# Patient Record
Sex: Female | Born: 1935 | Race: White | Hispanic: No | Smoking: Never smoker
Health system: Southern US, Community
[De-identification: ages and names within clinical notes are randomized; demographics above are authoritative.]

## PROBLEM LIST (undated history)

## (undated) DIAGNOSIS — E119 Type 2 diabetes mellitus without complications: Secondary | ICD-10-CM

## (undated) DIAGNOSIS — I10 Essential (primary) hypertension: Secondary | ICD-10-CM

## (undated) DIAGNOSIS — G309 Alzheimer's disease, unspecified: Secondary | ICD-10-CM

## (undated) DIAGNOSIS — F0280 Dementia in other diseases classified elsewhere without behavioral disturbance: Secondary | ICD-10-CM

---

## 2004-10-18 ENCOUNTER — Ambulatory Visit: Payer: Self-pay | Admitting: Internal Medicine

## 2005-01-10 ENCOUNTER — Ambulatory Visit: Payer: Self-pay

## 2005-03-07 ENCOUNTER — Ambulatory Visit: Payer: Self-pay | Admitting: Unknown Physician Specialty

## 2005-07-06 ENCOUNTER — Ambulatory Visit: Payer: Self-pay | Admitting: Internal Medicine

## 2005-07-22 ENCOUNTER — Ambulatory Visit: Payer: Self-pay | Admitting: General Surgery

## 2005-07-29 ENCOUNTER — Ambulatory Visit: Payer: Self-pay | Admitting: General Surgery

## 2006-01-02 ENCOUNTER — Ambulatory Visit: Payer: Self-pay | Admitting: Internal Medicine

## 2006-01-18 ENCOUNTER — Ambulatory Visit: Payer: Self-pay | Admitting: Pain Medicine

## 2006-05-15 ENCOUNTER — Ambulatory Visit: Payer: Self-pay | Admitting: Internal Medicine

## 2006-05-26 ENCOUNTER — Ambulatory Visit: Payer: Self-pay | Admitting: Internal Medicine

## 2006-06-25 ENCOUNTER — Ambulatory Visit: Payer: Self-pay | Admitting: Internal Medicine

## 2006-08-01 ENCOUNTER — Ambulatory Visit: Payer: Self-pay | Admitting: Internal Medicine

## 2006-08-04 ENCOUNTER — Ambulatory Visit: Payer: Self-pay | Admitting: Internal Medicine

## 2006-08-26 ENCOUNTER — Ambulatory Visit: Payer: Self-pay | Admitting: Internal Medicine

## 2007-01-09 ENCOUNTER — Ambulatory Visit: Payer: Self-pay | Admitting: Internal Medicine

## 2007-06-14 ENCOUNTER — Ambulatory Visit: Payer: Self-pay | Admitting: Internal Medicine

## 2008-03-12 ENCOUNTER — Ambulatory Visit: Payer: Self-pay | Admitting: Internal Medicine

## 2009-02-19 ENCOUNTER — Ambulatory Visit: Payer: Self-pay | Admitting: Internal Medicine

## 2009-03-24 ENCOUNTER — Ambulatory Visit: Payer: Self-pay | Admitting: Internal Medicine

## 2009-07-01 ENCOUNTER — Ambulatory Visit: Payer: Self-pay | Admitting: Rheumatology

## 2010-03-25 ENCOUNTER — Ambulatory Visit: Payer: Self-pay | Admitting: Internal Medicine

## 2010-06-22 ENCOUNTER — Ambulatory Visit: Payer: Self-pay | Admitting: Urology

## 2010-08-02 ENCOUNTER — Emergency Department: Payer: Self-pay | Admitting: Emergency Medicine

## 2010-09-22 ENCOUNTER — Ambulatory Visit: Payer: Self-pay | Admitting: Internal Medicine

## 2010-09-25 ENCOUNTER — Ambulatory Visit: Payer: Self-pay | Admitting: Internal Medicine

## 2010-10-26 ENCOUNTER — Ambulatory Visit: Payer: Self-pay | Admitting: Internal Medicine

## 2011-04-05 ENCOUNTER — Ambulatory Visit: Payer: Self-pay | Admitting: Internal Medicine

## 2011-07-19 ENCOUNTER — Ambulatory Visit: Payer: Self-pay | Admitting: Specialist

## 2011-09-27 ENCOUNTER — Ambulatory Visit: Payer: Self-pay | Admitting: Gastroenterology

## 2011-10-11 ENCOUNTER — Ambulatory Visit: Payer: Self-pay | Admitting: Gastroenterology

## 2011-10-20 ENCOUNTER — Ambulatory Visit: Payer: Self-pay | Admitting: Internal Medicine

## 2012-01-31 ENCOUNTER — Ambulatory Visit: Payer: Self-pay | Admitting: Specialist

## 2012-03-19 ENCOUNTER — Emergency Department: Payer: Self-pay | Admitting: *Deleted

## 2012-03-19 LAB — URINALYSIS, COMPLETE
Bilirubin,UR: NEGATIVE
Blood: NEGATIVE
Glucose,UR: 50 mg/dL (ref 0–75)
Hyaline Cast: 3
Nitrite: NEGATIVE
Ph: 5 (ref 4.5–8.0)
Protein: NEGATIVE
Specific Gravity: 1.017 (ref 1.003–1.030)
WBC UR: 1 /HPF (ref 0–5)

## 2012-03-19 LAB — COMPREHENSIVE METABOLIC PANEL
Albumin: 3.7 g/dL (ref 3.4–5.0)
Alkaline Phosphatase: 74 U/L (ref 50–136)
BUN: 19 mg/dL — ABNORMAL HIGH (ref 7–18)
Bilirubin,Total: 0.5 mg/dL (ref 0.2–1.0)
Calcium, Total: 9.3 mg/dL (ref 8.5–10.1)
Co2: 22 mmol/L (ref 21–32)
EGFR (Non-African Amer.): >60
Glucose: 218 mg/dL — ABNORMAL HIGH (ref 65–99)
Osmolality: 285 (ref 275–301)
Potassium: 4 mmol/L (ref 3.5–5.1)
SGPT (ALT): 20 U/L
Sodium: 138 mmol/L (ref 136–145)

## 2012-03-19 LAB — CBC: MCHC: 33.9 g/dL (ref 32.0–36.0)

## 2012-03-19 LAB — TROPONIN I: Troponin-I: <0.02 ng/mL

## 2012-07-20 ENCOUNTER — Ambulatory Visit: Payer: Self-pay | Admitting: Internal Medicine

## 2012-09-03 ENCOUNTER — Ambulatory Visit: Payer: Self-pay | Admitting: General Practice

## 2012-09-03 LAB — BASIC METABOLIC PANEL
Creatinine: 0.71 mg/dL (ref 0.60–1.30)
EGFR (African American): >60
EGFR (Non-African Amer.): >60
Glucose: 230 mg/dL — ABNORMAL HIGH (ref 65–99)
Potassium: 4.3 mmol/L (ref 3.5–5.1)
Sodium: 137 mmol/L (ref 136–145)

## 2012-09-03 LAB — URINALYSIS, COMPLETE
Bilirubin,UR: NEGATIVE
Glucose,UR: 50 mg/dL (ref 0–75)
Ketone: NEGATIVE
Protein: NEGATIVE
RBC,UR: 1 /HPF (ref 0–5)
Specific Gravity: 1.017 (ref 1.003–1.030)
Squamous Epithelial: 1
WBC UR: 1 /HPF (ref 0–5)

## 2012-09-03 LAB — SEDIMENTATION RATE: Erythrocyte Sed Rate: 29 mm/hr (ref 0–30)

## 2012-09-03 LAB — CBC
HCT: 34 % — ABNORMAL LOW (ref 35.0–47.0)
MCH: 31.3 pg (ref 26.0–34.0)
MCHC: 34.3 g/dL (ref 32.0–36.0)
MCV: 91 fL (ref 80–100)
Platelet: 209 10*3/uL (ref 150–440)
RDW: 13.2 % (ref 11.5–14.5)
WBC: 5.5 10*3/uL (ref 3.6–11.0)

## 2012-09-03 LAB — MRSA PCR SCREENING

## 2012-09-03 LAB — APTT: Activated PTT: 31.1 secs (ref 23.6–35.9)

## 2012-09-03 LAB — PROTIME-INR: Prothrombin Time: 13.7 secs (ref 11.5–14.7)

## 2012-09-04 LAB — URINE CULTURE

## 2012-09-06 ENCOUNTER — Ambulatory Visit: Payer: Self-pay | Admitting: Specialist

## 2012-09-17 ENCOUNTER — Inpatient Hospital Stay: Payer: Self-pay | Admitting: General Practice

## 2012-09-18 LAB — BASIC METABOLIC PANEL
BUN: 19 mg/dL — ABNORMAL HIGH (ref 7–18)
Calcium, Total: 8.6 mg/dL (ref 8.5–10.1)
Co2: 22 mmol/L (ref 21–32)
Creatinine: 0.97 mg/dL (ref 0.60–1.30)
EGFR (Non-African Amer.): 53 — ABNORMAL LOW
Osmolality: 284 (ref 275–301)
Potassium: 4.2 mmol/L (ref 3.5–5.1)

## 2012-09-18 LAB — HEMOGLOBIN: HGB: 10.1 g/dL — ABNORMAL LOW (ref 12.0–16.0)

## 2012-09-18 LAB — PLATELET COUNT: Platelet: 160 10*3/uL (ref 150–440)

## 2012-09-19 LAB — BASIC METABOLIC PANEL
Calcium, Total: 8.9 mg/dL (ref 8.5–10.1)
Chloride: 106 mmol/L (ref 98–107)
Co2: 24 mmol/L (ref 21–32)
Creatinine: 0.89 mg/dL (ref 0.60–1.30)
EGFR (Non-African Amer.): 59 — ABNORMAL LOW
Osmolality: 280 (ref 275–301)
Potassium: 4.5 mmol/L (ref 3.5–5.1)

## 2012-09-21 ENCOUNTER — Encounter: Payer: Self-pay | Admitting: Internal Medicine

## 2012-09-25 ENCOUNTER — Encounter: Payer: Self-pay | Admitting: Internal Medicine

## 2013-12-22 ENCOUNTER — Emergency Department: Payer: Self-pay | Admitting: Emergency Medicine

## 2013-12-22 LAB — URINALYSIS, COMPLETE
Glucose,UR: NEGATIVE mg/dL (ref 0–75)
Ketone: NEGATIVE
Protein: NEGATIVE
RBC,UR: 2 /HPF (ref 0–5)
Specific Gravity: 1.013 (ref 1.003–1.030)

## 2013-12-22 LAB — COMPREHENSIVE METABOLIC PANEL
Alkaline Phosphatase: 100 U/L
BUN: 18 mg/dL (ref 7–18)
Bilirubin,Total: 0.6 mg/dL (ref 0.2–1.0)
Chloride: 105 mmol/L (ref 98–107)
Creatinine: 0.78 mg/dL (ref 0.60–1.30)
EGFR (African American): >60
EGFR (Non-African Amer.): >60
Osmolality: 280 (ref 275–301)
Potassium: 4.1 mmol/L (ref 3.5–5.1)
SGOT(AST): 14 U/L — ABNORMAL LOW (ref 15–37)
SGPT (ALT): 13 U/L (ref 12–78)
Total Protein: 6.9 g/dL (ref 6.4–8.2)

## 2013-12-22 LAB — CBC WITH DIFFERENTIAL/PLATELET
Basophil #: 0 10*3/uL (ref 0.0–0.1)
Basophil %: 0.6 %
Eosinophil %: 2 %
HGB: 12.4 g/dL (ref 12.0–16.0)
Lymphocyte #: 1.6 10*3/uL (ref 1.0–3.6)
MCH: 29.3 pg (ref 26.0–34.0)
MCHC: 33.8 g/dL (ref 32.0–36.0)
MCV: 87 fL (ref 80–100)
Monocyte %: 7.6 %
Neutrophil %: 63.3 %
RBC: 4.25 10*6/uL (ref 3.80–5.20)
RDW: 13.5 % (ref 11.5–14.5)
WBC: 5.9 10*3/uL (ref 3.6–11.0)

## 2013-12-22 LAB — TROPONIN I: Troponin-I: <0.02 ng/mL

## 2013-12-23 LAB — URINE CULTURE

## 2015-01-09 ENCOUNTER — Emergency Department: Payer: Self-pay | Admitting: Emergency Medicine

## 2015-01-09 LAB — CBC
HCT: 37.7 % (ref 35.0–47.0)
HGB: 12.5 g/dL (ref 12.0–16.0)
MCH: 31.3 pg (ref 26.0–34.0)
MCHC: 33.2 g/dL (ref 32.0–36.0)
MCV: 94 fL (ref 80–100)
Platelet: 223 10*3/uL (ref 150–440)
RBC: 4 10*6/uL (ref 3.80–5.20)
RDW: 13.3 % (ref 11.5–14.5)
WBC: 9.2 10*3/uL (ref 3.6–11.0)

## 2015-01-09 LAB — COMPREHENSIVE METABOLIC PANEL
ALK PHOS: 81 U/L
ALT: 18 U/L
AST: 19 U/L (ref 15–37)
Albumin: 3.8 g/dL (ref 3.4–5.0)
Anion Gap: 8 (ref 7–16)
BUN: 11 mg/dL (ref 7–18)
Bilirubin,Total: 0.5 mg/dL (ref 0.2–1.0)
CALCIUM: 9.6 mg/dL (ref 8.5–10.1)
CHLORIDE: 104 mmol/L (ref 98–107)
Co2: 25 mmol/L (ref 21–32)
Creatinine: 0.92 mg/dL (ref 0.60–1.30)
GLUCOSE: 240 mg/dL — AB (ref 65–99)
Osmolality: 281 (ref 275–301)
Potassium: 4.3 mmol/L (ref 3.5–5.1)
Sodium: 137 mmol/L (ref 136–145)
TOTAL PROTEIN: 7.1 g/dL (ref 6.4–8.2)

## 2015-04-14 NOTE — Op Note (Signed)
PATIENT NAME:  Christine [name], Christine [name] MR#:  [id number] DATE OF BIRTH:  10/20/1924  DATE OF PROCEDURE:  09/17/2012  PREOPERATIVE DIAGNOSIS: Degenerative arthrosis of the right knee.   POSTOPERATIVE DIAGNOSIS: Degenerative arthrosis of the right knee.   PROCEDURE PERFORMED: Right total knee arthroplasty using computer-assisted navigation.   SURGEON: Illene Labrador. Angie Fava., MD    ASSISTANT: Van Clines, PA-C (required to maintain retraction throughout the procedure)   ANESTHESIA: Femoral nerve block and spinal.   ESTIMATED BLOOD LOSS: 75 mL.   FLUIDS REPLACED: 2300 mL of Crystalloid.   TOURNIQUET TIME: 77 minutes.   DRAINS: Two medium drains to reinfusion system.   SOFT TISSUE RELEASES: Anterior cruciate ligament, posterior cruciate ligament, deep medial collateral ligament, and patellofemoral ligament.   IMPLANTS UTILIZED: DePuy PFC Sigma size 2.5 posterior stabilized femoral component (cemented), size 2.5 MBT tibial component (cemented), 32 mm three peg oval dome patella (cemented), and a 10 mm stabilized rotating platform polyethylene insert.   INDICATIONS FOR SURGERY: The patient is an 81 year old female who has been seen for complaints of progressive right knee pain. X-rays demonstrated severe degenerative changes in a tricompartmental fashion with relative valgus deformity. The patient has not seen any significant improvement despite conservative nonsurgical intervention. After discussion of the risks and benefits of surgical intervention, the patient expressed her understanding of the risks and benefits and agreed with plans for surgical intervention.   PROCEDURE IN DETAIL: The patient was brought in the operating room and, after adequate femoral nerve block and spinal anesthesia was achieved, a tourniquet was placed on the patient's upper right thigh. The patient's right knee and leg were cleaned and prepped with alcohol and DuraPrep and draped in the usual sterile fashion. A "time-out"  was performed as per usual protocol. The right lower extremity was exsanguinated using an Esmarch and the tourniquet was inflated to 300 mmHg. Anterior longitudinal incision was made followed by a standard mid vastus approach. A moderate effusion was evacuated. The deep fibers of the medial collateral ligament were elevated in a subperiosteal fashion off the medial flare of the tibia so as to maintain a continuous soft tissue sleeve. Patella was subluxed laterally and patellofemoral ligament was incised. Inspection of the knee demonstrated severe degenerative changes with evidence of eburnated bone noted to the medial compartment. Prominent osteophytes were debrided using a rongeur. Anterior and posterior cruciate ligaments were excised. Two 4.0 mm Schanz pins were inserted into the femur and into the tibia for attachment of the array of spheres used for computer-assisted navigation. Hip center was identified using circumduction technique. Distal landmarks were mapped using the computer. Distal femur and proximal tibia were mapped using the computer. Distal femoral cutting guide was positioned using computer-assisted navigation so as to achieve 5 degree distal valgus cut. Cut was performed and verified using the computer. Distal femur was sized and it was felt that a size 2.5 femoral component was appropriate. Size 2.5 cutting guide was positioned and the anterior cut was performed and verified using the computer. This was followed by completion of the posterior and chamfer cuts. Femoral cutting guide for central box was then positioned and the central box cut was performed.   Attention was then directed to the proximal tibia. Medial and lateral menisci were excised. The extramedullary tibial cutting guide was positioned using computer-assisted navigation so as to achieve 0 degree varus valgus alignment and 0 degree posterior slope. Cut was performed and verified using the computer. Tibia was sized and it was felt  that  a size 2.5 tibial tray was appropriate. Tibial and femoral trials were inserted followed by insertion of a 10 mm polyethylene insert. Excellent mediolateral soft tissue balancing was appreciated both in full extension and in flexion. Finally, patella was cut and prepared so as to accommodate a 32 mm three peg oval dome patella. Patellar trial was placed and the knee was placed through a range of motion with excellent patellar tracking appreciated. Femoral trial was then removed. Central post hole for the tibial component was reamed followed by insertion of a keel punch. Tibial trial was then removed. Cut surfaces of bone were irrigated with copious amounts of normal saline with antibiotic solution using pulsatile lavage and then suctioned dry. Polymethyl methacrylate cement with gentamicin was mixed in the usual fashion using a vacuum mixer. Cement was applied to the cut surface of the proximal tibia as well as along the undersurface of a size 2.5 MBT tibial component. Tibial component was positioned and impacted into place. Excess cement was removed using freer elevators. Cement was then applied to the cut surface of the femur as well as along the posterior flanges of a size 2.5 posterior stabilized femoral component. Femoral component was positioned and impacted into place. Excess cement was removed using freer elevators. A 10 mm stabilized rotating platform polyethylene trial was inserted and the knee was brought in full extension with steady axial compression applied. Finally, cement was applied to the backside of a 32 mm three peg oval dome patella and patellar component was positioned and patellar clamp applied. Excess cement was removed using freer elevators.   After adequate curing of cement, the tourniquet was deflated after a total tourniquet time of 77 minutes. Hemostasis was achieved using electrocautery. The knee was irrigated with copious amounts of normal saline with antibiotic solution using  pulsatile lavage and then suctioned dry. The knee was inspected for any residual cement debris. 30 mL of 0.25% Marcaine with epinephrine was injected along the posterior capsule. A 10 mm stabilized rotating platform polyethylene insert was inserted and the knee was placed through a range of motion. Excellent patellar tracking was appreciated and excellent mediolateral soft tissue balancing was noted. Two medium drains were placed in the wound bed and brought out through a separate stab incision to be attached to a reinfusion system. The medial parapatellar portion of the incision was reapproximated using interrupted sutures of #1 Vicryl. Subcutaneous tissue was approximated in layers using first #0 Vicryl followed by #2-0 Vicryl. Skin was closed with skin staples. Sterile dressing was applied.   The patient tolerated the procedure well. She was transported to the recovery room in stable condition.   ____________________________ Illene LabradorJames P. Angie FavaHooten Jr., MD [id number] D: 09/17/2012 22:56:04 ET T: 09/18/2012 10:03:50 ET JOB#: [id number]  cc: Illene LabradorJames P. Angie FavaHooten Jr., MD, <Dictator> JAMES P Angie FavaHOOTEN JR MD ELECTRONICALLY SIGNED 09/18/2012 20:38

## 2015-04-14 NOTE — Discharge Summary (Signed)
PATIENT NAME:  Christine Christine [name], Christine Christine [name] MR#:  [id number] DATE OF BIRTH:  [birth year]  DATE OF ADMISSION:  09/17/2012 DATE OF DISCHARGE:  09/20/2012  ADMITTING DIAGNOSIS: Degenerative arthrosis of the right knee.   DISCHARGE DIAGNOSIS: Degenerative arthrosis of the right knee.   HISTORY: The patient is an 81 year old female who [facility] of right knee pain. She had initially been followed by Dr[facility] and has had multiple aspirations and intraarticular cortisone injections as well as Synvisc injections. The patient states the pain continued to progress despite these interventions. She had reported pain both to the medial and lateral aspect of the knee. Her pain was noted be aggravated with weight-bearing activities. She did report some episodes of swelling of the knee as well as some near giving way. The pain had progressed to the point that it significantly interfered with her activities of daily living. X-rays taken in the clinic showed narrowing of the lateral cartilage space with associated valgus alignment. There was subchondral sclerosis noted. Some small osteophyte formation was noted. After discussion of the risks and benefits of surgical intervention, the patient expressed her understanding of the risks and benefits and agreed with plans for surgical intervention.   PROCEDURE: Right total knee arthroplasty using computer-assisted navigation.   ANESTHESIA: Femoral nerve block with spinal.   IMPLANTS UTILIZED: DePuy PFC Sigma size 2.5 posterior stabilized femoral component (cemented), size 2.5 MBT tibial component (cemented), 32 mm three pegged oval dome patella (cemented), and Christine [name] 10 mm stabilized rotating platform polyethylene insert.   HOSPITAL COURSE: The patient tolerated the procedure very well. She had no complications. She was then taken to the PAC-U where she was stabilized and then transferred to the orthopedic floor. She began receiving  anticoagulation therapy of Lovenox 30 mg subcutaneous every 12 hours per anesthesia and pharmacy protocol. She was fitted with TED stockings bilaterally. These were allowed to be removed one hour per eight hour shift. The right one was applied on day following removal of the Hemovac and dressing change. The patient was also fitted with the AV-I compression foot pumps bilaterally set at 80 mmHg. Her calves have been nontender. There has been no evidence of any deep venous thrombosis to the lower extremity. She has denied any chest pain or any shortness of breath. Heels were elevated off the bed using rolled towels.   Vital signs have been stable. She has been afebrile. Hemodynamically she was stable and no transfusions were given other than Autovac transfusions given the first six hours postoperatively. Laboratory studies were felt to be acceptable.   Physical therapy was initiated on day one for gait training and transfers. She has done very well. She has progressed very nicely. Occupational therapy was also initiated on day one for activities of daily living and assistive devices.   The patient's IV, Foley and Hemovac were discontinued on day two along with Christine [name] dressing change. The wound was free of any drainage or any signs of infection. The Polar Care was reapplied to the surgical leg maintaining Christine [name] temperature of 40 to 50 degrees Fahrenheit.   DISPOSITION: The patient is being discharged to [name] facility in improved stable condition.  DISCHARGE INSTRUCTIONS: Continue wearing TED stockings bilaterally. These are allowed to be removed one hour per eight hour shift. She may weight bear as tolerated. Incentive spirometer every 1 hour while awake. Encourage cough and deep breathing every two hours while awake. Polar Care to the surgical leg maintaining Christine [name]  temperature of 40 to 50 degrees Fahrenheit. Physical therapy for gait training and transfers, occupational therapy for activities of daily living  and assistive devices. Elevate heels off the bed. She is placed on an ADA diet. She has Christine [name] follow-up appointment in the clinic in two weeks, sooner if any temperatures of 101.5 or greater. Change dressing as needed.   DRUG ALLERGIES: Macrobid and penicillin.   DISCHARGE MEDICATIONS: 1. Tylenol ES 500 to 1000 mg every 4 to 6 hours p.r.n.  2. Celebrex 200 mg twice Christine [name] day. 3. Roxicodone 5 to 10 mg every four hours p.r.n. 4. Tramadol 50 to 100 mg every four hours p.r.n.  5. Dulcolax suppositories 10 mg rectally daily p.r.n.  6. Milk of magnesia 30 mL twice Christine [name] day.  7. Enema soapsuds if no results with milk of magnesia or Dulcolax.  8. Mylanta DS 30 mL every 6 hours.  9. Pantoprazole 40 mg twice Christine [name] day. 10. Senokot-S 1 tablet twice Christine [name] day. 11. Glipizide XL 10 mg before breakfast. 12. Metformin 1000 mg twice Christine [name] day with meals.  13. Insulin sliding scale Novolin R injections.  14. Hydrochlorothiazide 12.5 mg daily. 15. Lisinopril 40 mg daily. 16. Pravachol 40 mg at bedtime.  17. Ocuvite PreserVision 1 tablet daily. 18. Norvasc 10 mg daily.  19. Ferrous sulfate 325 mg daily with meal. 20. Pulmicort Flexhaler one puff inhalation every 12 hours.  21. Celexa 40 mg at bedtime.  22. Lovenox 30 mg subcutaneous every 12 hours for 14 days then discontinue and begin taking one 81 mg enteric coated aspirin unless contraindicated.  23. Zestril 40 mg at bedtime.  24. Pioglitazone 45 mg daily with meal.  PAST MEDICAL HISTORY:  1. Chickenpox. 2. Asthma.  3. Seasonal allergies.  4. Hypertension.  5. Hypercholesterolemia.  6. Type 2 diabetes mellitus.  7. Shingles.  8. Cataracts of bilateral eyes.  9. Osteoporosis.  10. Osteoarthritis.  11. Diverticulosis.  12. Fibrocystic breast disease.  13. Aortic insufficiency.         14. Venous stasis. 15. Anemia.  ____________________________ Van Clines, PA [id number] D: 09/20/2012 07:43:50 ET T: 09/20/2012 08:16:13 ET JOB#: [id number]  cc: Van Clines,  PA, <Dictator> JON WOLFE PA ELECTRONICALLY SIGNED 09/22/2012 20:47

## 2016-10-04 ENCOUNTER — Encounter: Payer: Self-pay | Admitting: Emergency Medicine

## 2016-10-04 ENCOUNTER — Emergency Department: Payer: Medicare Other

## 2016-10-04 ENCOUNTER — Emergency Department
Admission: EM | Admit: 2016-10-04 | Discharge: 2016-10-04 | Disposition: A | Discharge status: Home / Self Care | Payer: Medicare Other | Attending: Emergency Medicine | Admitting: Emergency Medicine

## 2016-10-04 DIAGNOSIS — E119 Type 2 diabetes mellitus without complications: Secondary | ICD-10-CM | POA: Insufficient documentation

## 2016-10-04 DIAGNOSIS — Y999 Unspecified external cause status: Secondary | ICD-10-CM | POA: Insufficient documentation

## 2016-10-04 DIAGNOSIS — S0990XA Unspecified injury of head, initial encounter: Secondary | ICD-10-CM | POA: Diagnosis present

## 2016-10-04 DIAGNOSIS — G309 Alzheimer's disease, unspecified: Secondary | ICD-10-CM | POA: Insufficient documentation

## 2016-10-04 DIAGNOSIS — Y92009 Unspecified place in unspecified non-institutional (private) residence as the place of occurrence of the external cause: Secondary | ICD-10-CM

## 2016-10-04 DIAGNOSIS — N3000 Acute cystitis without hematuria: Secondary | ICD-10-CM | POA: Diagnosis not present

## 2016-10-04 DIAGNOSIS — Y92018 Other place in single-family (private) house as the place of occurrence of the external cause: Secondary | ICD-10-CM | POA: Diagnosis not present

## 2016-10-04 DIAGNOSIS — S0083XA Contusion of other part of head, initial encounter: Secondary | ICD-10-CM | POA: Diagnosis not present

## 2016-10-04 DIAGNOSIS — W19XXXA Unspecified fall, initial encounter: Secondary | ICD-10-CM

## 2016-10-04 DIAGNOSIS — W1839XA Other fall on same level, initial encounter: Secondary | ICD-10-CM | POA: Diagnosis not present

## 2016-10-04 DIAGNOSIS — I10 Essential (primary) hypertension: Secondary | ICD-10-CM | POA: Diagnosis not present

## 2016-10-04 DIAGNOSIS — Y9389 Activity, other specified: Secondary | ICD-10-CM | POA: Insufficient documentation

## 2016-10-04 DIAGNOSIS — R5383 Other fatigue: Secondary | ICD-10-CM

## 2016-10-04 HISTORY — DX: Type 2 diabetes mellitus without complications: E11.9

## 2016-10-04 HISTORY — DX: Essential (primary) hypertension: I10

## 2016-10-04 HISTORY — DX: Dementia in other diseases classified elsewhere without behavioral disturbance: F02.80

## 2016-10-04 HISTORY — DX: Alzheimer's disease, unspecified: G30.9

## 2016-10-04 LAB — URINALYSIS COMPLETE WITH MICROSCOPIC (ARMC ONLY)
Bilirubin Urine: NEGATIVE
GLUCOSE, UA: NEGATIVE mg/dL
Ketones, ur: NEGATIVE mg/dL
Nitrite: NEGATIVE
Protein, ur: 100 mg/dL — AB
SPECIFIC GRAVITY, URINE: 1.016 (ref 1.005–1.030)
pH: 5 (ref 5.0–8.0)

## 2016-10-04 LAB — CBC
HCT: 35.1 % (ref 35.0–47.0)
Hemoglobin: 11.9 g/dL — ABNORMAL LOW (ref 12.0–16.0)
MCH: 31.7 pg (ref 26.0–34.0)
MCHC: 34 g/dL (ref 32.0–36.0)
MCV: 93.2 fL (ref 80.0–100.0)
PLATELETS: 246 10*3/uL (ref 150–440)
RBC: 3.77 MIL/uL — AB (ref 3.80–5.20)
RDW: 13 % (ref 11.5–14.5)
WBC: 10.7 10*3/uL (ref 3.6–11.0)

## 2016-10-04 LAB — COMPREHENSIVE METABOLIC PANEL
ALK PHOS: 62 U/L (ref 38–126)
ALT: 13 U/L — AB (ref 14–54)
AST: 37 U/L (ref 15–41)
Albumin: 3.9 g/dL (ref 3.5–5.0)
Anion gap: 11 (ref 5–15)
BUN: 20 mg/dL (ref 6–20)
CALCIUM: 10.2 mg/dL (ref 8.9–10.3)
CO2: 21 mmol/L — ABNORMAL LOW (ref 22–32)
CREATININE: 1.12 mg/dL — AB (ref 0.44–1.00)
Chloride: 101 mmol/L (ref 101–111)
GFR, EST AFRICAN AMERICAN: 49 mL/min — AB (ref >60)
GFR, EST NON AFRICAN AMERICAN: 42 mL/min — AB (ref >60)
Glucose, Bld: 164 mg/dL — ABNORMAL HIGH (ref 65–99)
Potassium: 4.6 mmol/L (ref 3.5–5.1)
Sodium: 133 mmol/L — ABNORMAL LOW (ref 135–145)
Total Bilirubin: 0.7 mg/dL (ref 0.3–1.2)
Total Protein: 6.9 g/dL (ref 6.5–8.1)

## 2016-10-04 MED ORDER — CEPHALEXIN 500 MG PO CAPS
500.0000 mg | ORAL_CAPSULE | Freq: Once | ORAL | Status: AC
  Administered 2016-10-04: 500 mg via ORAL

## 2016-10-04 MED ORDER — CEPHALEXIN 500 MG PO CAPS
ORAL_CAPSULE | ORAL | Status: AC
  Administered 2016-10-04: 500 mg via ORAL
  Filled 2016-10-04: qty 1

## 2016-10-04 MED ORDER — CEPHALEXIN 500 MG PO CAPS: 500.0000 mg | ORAL_CAPSULE | Freq: Two times a day (BID) | ORAL | 0 refills | Status: DC

## 2016-10-04 NOTE — ED Provider Notes (Signed)
[facility] Emergency Department Provider Note  ____________________________________________  Time seen: Approximately 4:18 PM  I have reviewed the triage vital signs and the nursing notes.   HISTORY  Chief Complaint Fall and Altered Mental Status  Level 5 caveat:  Portions of the history and physical were unable to be obtained due to the patient's chronic dementia   HPI Christine [name] is a 82 y.o. female brought to the ED after a fall last night. The patient reported at the time that she just "missed the bed". She has dementia and is unable to recall the event very well at this time. Her daughter who accompanies her to the ED does report that the patient has been eating and drinking well, and even after the fall ambulatory without difficulty. They're concerned that she seems a little bit less mentally alert and sharp today and even a little bit yesterday afternoon before the fall. They're concerned that she could've UTI. Patient reports a very mild right-sided headache in the area of an obvious hematoma, but otherwise states that she feels fine and denies any neck pain. No vision changes. No focal weakness. No vomiting. No nausea     Past Medical History:  Diagnosis Date  . Alzheimer disease   . Diabetes mellitus without complication (HCC)   . Hypertension      There are no active problems to display for this patient.    No past surgical history on file.   Prior to Admission medications   Medication Sig Start Date End Date Taking? Authorizing Provider  cephALEXin (KEFLEX) 500 MG capsule Take 1 capsule (500 mg total) by mouth 2 (two) times daily. 10/04/16   Sharman Cheek, MD     Allergies Ciprofloxacin; Macrobid [nitrofurantoin monohyd macro]; and Penicillins   No family history on file.  Social History Social History  Substance Use Topics  . Smoking status: Not on file  . Smokeless tobacco: Not on file  . Alcohol use Not on file     Review of Systems  Constitutional:   No fever or chills.  ENT:   No sore throat. No rhinorrhea. Cardiovascular:   No chest pain. Respiratory:   No dyspnea or cough. Gastrointestinal:   Negative for abdominal pain, vomiting and diarrhea.  Genitourinary:   Negative for dysuria or difficulty urinating.No change in bowel habits Musculoskeletal:   Negative for focal pain or swelling Neurological:   Mild right parietal headache 10-point ROS otherwise negative.  ____________________________________________   PHYSICAL EXAM:  VITAL SIGNS: ED Triage Vitals [10/04/16 1536]  Enc Vitals Group     BP 116/63     Pulse Rate 91     Resp 17     Temp 98.3 F (36.8 C)     Temp Source Oral     SpO2 92 %     Weight 150 lb (68 kg)     Height 5\' 3"  (1.6 m)     Head Circumference      Peak Flow      Pain Score 0     Pain Loc      Pain Edu?      Excl. in GC?     Vital signs reviewed, nursing assessments reviewed.   Constitutional:   Alert and orientedTo person and place. Well appearing and in no distress. Eyes:   No scleral icterus. No conjunctival pallor. PERRL. EOMI.  No nystagmus. ENT   Head:   Normocephalic with scalp hematoma overlying the right temporal area. Some tracking  of ecchymosis to the right of the right orbit, but no swelling about the eye or bony tenderness. No step-offs or evidence of skull fracture..   Nose:   No congestion/rhinnorhea. No septal hematoma   Mouth/Throat:   MMM, no pharyngeal erythema. No peritonsillar mass.    Neck:   No stridor. No SubQ emphysema. No meningismus. Hematological/Lymphatic/Immunilogical:   No cervical lymphadenopathy. Cardiovascular:   RRR. Symmetric bilateral radial and DP pulses.  No murmurs.  Respiratory:   Normal respiratory effort without tachypnea nor retractions. Breath sounds are clear and equal bilaterally. No wheezes/rales/rhonchi. Gastrointestinal:   Soft and nontender. Non distended. There is no CVA tenderness.  No  rebound, rigidity, or guarding. Genitourinary:   deferred Musculoskeletal:   Nontender with normal range of motion in all extremities. No joint effusions.  No lower extremity tenderness.  No edema. Neurologic:   Normal speech and language.  CN 2-10 normal. Motor grossly intact. No gross focal neurologic deficits are appreciated.  Skin:    Skin is warm, dry and intact. Facial ecchymosis as above.  ____________________________________________    LABS (pertinent positives/negatives) (all labs ordered are listed, but only abnormal results are displayed) Labs Reviewed  COMPREHENSIVE METABOLIC PANEL - Abnormal; Notable for the following:       Result Value   Sodium 133 (*)    CO2 21 (*)    Glucose, Bld 164 (*)    Creatinine, Ser 1.12 (*)    ALT 13 (*)    GFR calc non Af Amer 42 (*)    GFR calc Af Amer 49 (*)    All other components within normal limits  CBC - Abnormal; Notable for the following:    RBC 3.77 (*)    Hemoglobin 11.9 (*)    All other components within normal limits  URINALYSIS COMPLETEWITH MICROSCOPIC ([facility] ONLY) - Abnormal; Notable for the following:    Color, Urine AMBER (*)    APPearance TURBID (*)    Hgb urine dipstick 1+ (*)    Protein, ur 100 (*)    Leukocytes, UA 3+ (*)    Bacteria, UA RARE (*)    Squamous Epithelial / LPF 0-5 (*)    All other components within normal limits  URINE CULTURE   ____________________________________________   EKG  Interpreted by me Normal sinus rhythm rate of 91, left axis, right bundle branch block. Diffuse T-wave inversions consistent with repolarization abnormality. Voltage criteria for LVH in the high lateral leads. This is unchanged compared to 12/22/2013  ____________________________________________    RADIOLOGY  CT head unremarkable CT maxillofacial unremarkable  ____________________________________________   PROCEDURES Procedures  ____________________________________________   INITIAL IMPRESSION /  ASSESSMENT AND PLAN / ED COURSE  Pertinent labs & imaging results that were available during my care of the patient were reviewed by me and considered in my medical decision making (see chart for details).  Patient well appearing no acute distress, presents with facial hematoma and contusion after blunt head trauma. Low suspicion for intracranial hemorrhage. CT negative for any other significant traumatic injury. Labs unremarkable. Awaiting urinalysis to further screening for any causative illness that may have made the patient a little bit weaker or off balance and precipitated the fall. Otherwise she is medically stable and suitable for outpatient follow-up with her primary care doctor.     Clinical Course    ----------------------------------------- 5:52 PM on 10/04/2016 -----------------------------------------  Urinalysis shows clear UTI. Urine culture pending. Keflex given by mouth in ED, prescription given. Discussed with daughter and patient.  Daughter will stay with the patient tonight to ensure her well-being. Patient does have a home health aide who comes throughout the day and will be or to assist with medications and ensure that she is continuing to do well. Return precautions given. Patient does not appear to warrant inpatient management at this time as she is sitting upright, calm comfortable with unremarkable vital signs and tolerating oral intake. ____________________________________________   FINAL CLINICAL IMPRESSION(S) / ED DIAGNOSES  Final diagnoses:  Injury of head, initial encounter  Contusion of face, initial encounter  Fall in home, initial encounter  Acute cystitis without hematuria  Fatigue, unspecified type       Portions of this note were generated with dragon dictation software. Dictation errors may occur despite best attempts at proofreading.    Sharman CheekPhillip Assia Meanor, MD 10/04/16 [phone]

## 2016-10-04 NOTE — ED Triage Notes (Signed)
Pt here from home after a fall last night around midnight. Daughter reports pt has been more confused than normal since yesterday, pt unsteady on feet. Pt is not on blood thinners, large purple in color bruise to right side of head, pt unsure of LOC.

## 2016-10-04 NOTE — ED Notes (Signed)
Pt attempted to give urine sample but was unable. Pt given a cup of water and instructed to let RN know when she needed to urinate.

## 2016-10-05 LAB — URINE CULTURE

## 2016-10-23 ENCOUNTER — Emergency Department: Payer: Medicare Other

## 2016-10-23 ENCOUNTER — Emergency Department
Admission: EM | Admit: 2016-10-23 | Discharge: 2016-10-23 | Disposition: A | Discharge status: Home / Self Care | Payer: Medicare Other | Attending: Student in an Organized Health Care Education/Training Program | Admitting: Student in an Organized Health Care Education/Training Program

## 2016-10-23 DIAGNOSIS — E860 Dehydration: Secondary | ICD-10-CM | POA: Diagnosis not present

## 2016-10-23 DIAGNOSIS — E119 Type 2 diabetes mellitus without complications: Secondary | ICD-10-CM | POA: Diagnosis not present

## 2016-10-23 DIAGNOSIS — I10 Essential (primary) hypertension: Secondary | ICD-10-CM | POA: Insufficient documentation

## 2016-10-23 DIAGNOSIS — R410 Disorientation, unspecified: Secondary | ICD-10-CM | POA: Insufficient documentation

## 2016-10-23 LAB — COMPREHENSIVE METABOLIC PANEL
ALT: 13 U/L — ABNORMAL LOW (ref 14–54)
AST: 33 U/L (ref 15–41)
Albumin: 3.9 g/dL (ref 3.5–5.0)
Alkaline Phosphatase: 68 U/L (ref 38–126)
Anion gap: 12 (ref 5–15)
BUN: 15 mg/dL (ref 6–20)
CHLORIDE: 97 mmol/L — AB (ref 101–111)
CO2: 25 mmol/L (ref 22–32)
Calcium: 10.7 mg/dL — ABNORMAL HIGH (ref 8.9–10.3)
Creatinine, Ser: 1.37 mg/dL — ABNORMAL HIGH (ref 0.44–1.00)
GFR, EST AFRICAN AMERICAN: 38 mL/min — AB (ref >60)
GFR, EST NON AFRICAN AMERICAN: 33 mL/min — AB (ref >60)
Glucose, Bld: 251 mg/dL — ABNORMAL HIGH (ref 65–99)
POTASSIUM: 5.3 mmol/L — AB (ref 3.5–5.1)
SODIUM: 134 mmol/L — AB (ref 135–145)
Total Bilirubin: 0.5 mg/dL (ref 0.3–1.2)
Total Protein: 7 g/dL (ref 6.5–8.1)

## 2016-10-23 LAB — CBC
HEMATOCRIT: 37.1 % (ref 35.0–47.0)
HEMOGLOBIN: 12.5 g/dL (ref 12.0–16.0)
MCH: 32.3 pg (ref 26.0–34.0)
MCHC: 33.8 g/dL (ref 32.0–36.0)
MCV: 95.3 fL (ref 80.0–100.0)
Platelets: 277 10*3/uL (ref 150–440)
RBC: 3.89 MIL/uL (ref 3.80–5.20)
RDW: 13.4 % (ref 11.5–14.5)
WBC: 8.1 10*3/uL (ref 3.6–11.0)

## 2016-10-23 LAB — BASIC METABOLIC PANEL
ANION GAP: 8 (ref 5–15)
BUN: 16 mg/dL (ref 6–20)
CALCIUM: 9.5 mg/dL (ref 8.9–10.3)
CO2: 24 mmol/L (ref 22–32)
CREATININE: 1.23 mg/dL — AB (ref 0.44–1.00)
Chloride: 103 mmol/L (ref 101–111)
GFR, EST AFRICAN AMERICAN: 43 mL/min — AB (ref >60)
GFR, EST NON AFRICAN AMERICAN: 37 mL/min — AB (ref >60)
Glucose, Bld: 205 mg/dL — ABNORMAL HIGH (ref 65–99)
Potassium: 4.6 mmol/L (ref 3.5–5.1)
SODIUM: 135 mmol/L (ref 135–145)

## 2016-10-23 LAB — GLUCOSE, CAPILLARY: Glucose-Capillary: 238 mg/dL — ABNORMAL HIGH (ref 65–99)

## 2016-10-23 MED ORDER — SODIUM CHLORIDE 0.9 % IV BOLUS (SEPSIS)
1000.0000 mL | Freq: Once | INTRAVENOUS | Status: AC
  Administered 2016-10-23: 1000 mL via INTRAVENOUS

## 2016-10-23 NOTE — ED Triage Notes (Signed)
Daughter reports her mother was more confused today than usual so she that she was beginning with a UTI. [facility] and had a negative urinalysis. The patient states she had felt more weak today and shaky.. Daughter states today her mother had been "more confused and unable to understand things". Hx of Alzheimer's disease but today is different per daughter. Pt alert to self.

## 2016-10-23 NOTE — ED Provider Notes (Signed)
[facility] Emergency Department Provider Note    None    (approximate)  I have reviewed the triage vital signs and the nursing notes.   HISTORY  Chief Complaint No chief complaint on file.  Level V Caveat:  Chronic dementia  HPI Christine GravelLouise A Ritchey is a 82 y.o. female with also has disease as well as diabetes and history of hypertension presents with confusion and shakiness. She was resides in the ER and diagnosed with the UTI. At that point she was having similar symptoms which improved after antibiotics.  The daughters at bedside and states that she's had decreased oral intake the past several days. States that she was otherwise behaving at baseline yesterday but altered today and states that she is "seeing things." She states that she'll comment that the drapes look like strings in a knot. Patient is otherwise pleasant and has no other complaints.   Past Medical History:  Diagnosis Date  . Alzheimer disease   . Diabetes mellitus without complication (HCC)   . Hypertension     There are no active problems to display for this patient.   No recent surgeries  Prior to Admission medications   Medication Sig Start Date End Date Taking? Authorizing Provider  cephALEXin (KEFLEX) 500 MG capsule Take 1 capsule (500 mg total) by mouth 2 (two) times daily. 10/04/16   Sharman CheekPhillip Stafford, MD    Allergies Ciprofloxacin; Macrobid [nitrofurantoin monohyd macro]; and Penicillins  No family history on file.  Social History Social History  Substance Use Topics  . Smoking status: Not on file  . Smokeless tobacco: Not on file  . Alcohol use Not on file    Review of Systems Patient denies headaches, rhinorrhea, blurry vision, numbness, shortness of breath, chest pain, edema, cough, abdominal pain, nausea, vomiting, diarrhea, dysuria, fevers, rashes or hallucinations unless otherwise stated above in  HPI. ____________________________________________   PHYSICAL EXAM:  VITAL SIGNS: Vitals:   10/23/16 1634  BP: 129/62  Pulse: 87  Resp: 20  Temp: 98.1 F (36.7 C)    Constitutional: Elderly female who is alert and oriented to person but not place or time. Appears in no acute distress Eyes: Conjunctivae are normal. PERRL. EOMI. Head: Atraumatic. Nose: No congestion/rhinnorhea. Mouth/Throat: Mucous membranes are dry.  Oropharynx non-erythematous. Neck: No stridor. Painless ROM. No cervical spine tenderness to palpation Hematological/Lymphatic/Immunilogical: No cervical lymphadenopathy. Cardiovascular: Normal rate, regular rhythm. Grossly normal heart sounds.  Good peripheral circulation. Respiratory: Normal respiratory effort.  No retractions. Lungs CTAB. Gastrointestinal: Soft and nontender. No distention. No abdominal bruits. No CVA tenderness.   Musculoskeletal: No lower extremity tenderness nor edema.  No joint effusions. Neurologic:  Normal speech and language. No gross focal neurologic deficits are appreciated. No gait instability. Skin:  Skin is warm, dry and intact. No bruising  ____________________________________________   LABS (all labs ordered are listed, but only abnormal results are displayed)  Results for orders placed or performed during the hospital encounter of 10/23/16 (from the past 24 hour(s))  Comprehensive metabolic panel     Status: Abnormal   Collection Time: 10/23/16  4:52 PM  Result Value Ref Range   Sodium 134 (L) 135 - 145 mmol/L   Potassium 5.3 (H) 3.5 - 5.1 mmol/L   Chloride 97 (L) 101 - 111 mmol/L   CO2 25 22 - 32 mmol/L   Glucose, Bld 251 (H) 65 - 99 mg/dL   BUN 15 6 - 20 mg/dL   Creatinine, Ser 1.611.37 (H) 0.44 - 1.00  mg/dL   Calcium 91.410.7 (H) 8.9 - 10.3 mg/dL   Total Protein 7.0 6.5 - 8.1 g/dL   Albumin 3.9 3.5 - 5.0 g/dL   AST 33 15 - 41 U/L   ALT 13 (L) 14 - 54 U/L   Alkaline Phosphatase 68 38 - 126 U/L   Total Bilirubin 0.5 0.3 - 1.2  mg/dL   GFR calc non Af Amer 33 (L) >60 mL/min   GFR calc Af Amer 38 (L) >60 mL/min   Anion gap 12 5 - 15  CBC     Status: None   Collection Time: 10/23/16  4:52 PM  Result Value Ref Range   WBC 8.1 3.6 - 11.0 K/uL   RBC 3.89 3.80 - 5.20 MIL/uL   Hemoglobin 12.5 12.0 - 16.0 g/dL   HCT 78.237.1 95.635.0 - 21.347.0 %   MCV 95.3 80.0 - 100.0 fL   MCH 32.3 26.0 - 34.0 pg   MCHC 33.8 32.0 - 36.0 g/dL   RDW 08.613.4 57.811.5 - 46.914.5 %   Platelets 277 150 - 440 K/uL  Glucose, capillary     Status: Abnormal   Collection Time: 10/23/16  4:52 PM  Result Value Ref Range   Glucose-Capillary 238 (H) 65 - 99 mg/dL   ____________________________________________  EKG____________________________________________  RADIOLOGY  I personally reviewed all radiographic images ordered to evaluate for the above acute complaints and reviewed radiology reports and findings.  These findings were personally discussed with the patient.  Please see medical record for radiology report.  ____________________________________________   PROCEDURES  Procedure(s) performed: none    Critical Care performed: no ____________________________________________   INITIAL IMPRESSION / ASSESSMENT AND PLAN / ED COURSE  Pertinent labs & imaging results that were available during my care of the patient were reviewed by me and considered in my medical decision making (see chart for details).  DDX: Dehydration, hypercalcemia, CVA, UTI, hypoglycemia  Christine [name] is a 82 y.o. who presents to the ED with Altered mental status and decreased oral intake. Patient was in agonal clinic today and had negative Ua.  This patient was feeling more weak and shaky are brought to the ER for further evaluation. No reported trauma.  Patient afebrile and hemodynamically stable. Does appear dehydrated. Given the report of decreased oral intake, labs checked and show evidence of mild hypercalcemia and borderline hyperkalemia with evidence of dehydration.  No significant acidosis. Will give patient IV fluid bolus in the ER and continue to monitor.  Clinical Course  Comment By Time  No leukocytosis. Patient now much improved after IV hydration.  Early repeating BMP. Patient is sitting upright smiling and having appropriate conversation with caregiver daughter at bedside. Daughter at bedside states that she is now acting appropriately after IV hydration. Patient still not been able to provide us satisfactory urine sample but she denies any dysuria or flank pain. She has no fevers. I recommended we get a catheterized specimen to evaluate for UTI but the patient and caregiver have declined this Which seems appropriate as she had a clinic UA that was not suggestive of UTI.  She is currently tolerating oral hydration has no further complaints at this time. Willy EddyPatrick Kelen Laura, MD 10/29 2032   ----------------------------------------- 8:45 PM on 10/23/2016 -----------------------------------------  Repeat BMP shows improvement and sodium and calcium as well as renal function. Presentation most consistent with dehydration in the setting of known Alzheimer's dementia.  As the patient is sitting upright he mechanically stable and well-appearing at this time to fill  patient is appropriate for further outpatient management.  I discussed other methods for the caregiver and family members to encourage oral intake and to follow-up with primary care physician this week for repeat blood work. Have discussed with the patient and available family all diagnostics and treatments performed thus far and all questions were answered to the best of my ability. The patient demonstrates understanding and agreement with plan.   ____________________________________________   FINAL CLINICAL IMPRESSION(S) / ED DIAGNOSES  Final diagnoses:  Dehydration  Confusion      NEW MEDICATIONS STARTED DURING THIS VISIT:  New Prescriptions   No medications on file     Note:  This  document was prepared using Dragon voice recognition software and may include unintentional dictation errors.    Willy Eddy, MD 10/23/16 2047

## 2016-10-23 NOTE — ED Notes (Signed)
MD at bedside. 

## 2016-10-23 NOTE — Discharge Instructions (Signed)
Please be sure to drink one waterbottle with every meal to increase hydration.  You can mix 1/2 water and 1/2 gatorade or juice.  Return for weakness, headache, nausea or vomiting.  Follow up with PCP office in 1 week for repeat lab testing.

## 2016-10-23 NOTE — ED Notes (Signed)
Pt states last night she thought she saw people dressed in her house in halloween costumes.

## 2016-11-16 ENCOUNTER — Emergency Department
Admission: EM | Admit: 2016-11-16 | Discharge: 2016-11-16 | Disposition: A | Discharge status: Home / Self Care | Payer: Medicare Other | Attending: Emergency Medicine | Admitting: Emergency Medicine

## 2016-11-16 ENCOUNTER — Encounter: Payer: Self-pay | Admitting: Emergency Medicine

## 2016-11-16 DIAGNOSIS — R531 Weakness: Secondary | ICD-10-CM | POA: Insufficient documentation

## 2016-11-16 DIAGNOSIS — I10 Essential (primary) hypertension: Secondary | ICD-10-CM | POA: Insufficient documentation

## 2016-11-16 DIAGNOSIS — Z79899 Other long term (current) drug therapy: Secondary | ICD-10-CM | POA: Insufficient documentation

## 2016-11-16 DIAGNOSIS — G309 Alzheimer's disease, unspecified: Secondary | ICD-10-CM | POA: Insufficient documentation

## 2016-11-16 DIAGNOSIS — Z7982 Long term (current) use of aspirin: Secondary | ICD-10-CM | POA: Diagnosis not present

## 2016-11-16 DIAGNOSIS — E119 Type 2 diabetes mellitus without complications: Secondary | ICD-10-CM | POA: Diagnosis not present

## 2016-11-16 DIAGNOSIS — Z7984 Long term (current) use of oral hypoglycemic drugs: Secondary | ICD-10-CM | POA: Insufficient documentation

## 2016-11-16 DIAGNOSIS — R350 Frequency of micturition: Secondary | ICD-10-CM | POA: Diagnosis present

## 2016-11-16 DIAGNOSIS — N390 Urinary tract infection, site not specified: Secondary | ICD-10-CM | POA: Insufficient documentation

## 2016-11-16 LAB — URINALYSIS COMPLETE WITH MICROSCOPIC (ARMC ONLY)
BILIRUBIN URINE: NEGATIVE
GLUCOSE, UA: NEGATIVE mg/dL
KETONES UR: NEGATIVE mg/dL
NITRITE: NEGATIVE
Protein, ur: NEGATIVE mg/dL
SPECIFIC GRAVITY, URINE: 1.004 — AB (ref 1.005–1.030)
Squamous Epithelial / LPF: NONE SEEN
pH: 7 (ref 5.0–8.0)

## 2016-11-16 LAB — COMPREHENSIVE METABOLIC PANEL
ALK PHOS: 62 U/L (ref 38–126)
ALT: 11 U/L — ABNORMAL LOW (ref 14–54)
ANION GAP: 9 (ref 5–15)
AST: 24 U/L (ref 15–41)
Albumin: 3.7 g/dL (ref 3.5–5.0)
BUN: 18 mg/dL (ref 6–20)
CALCIUM: 10.2 mg/dL (ref 8.9–10.3)
CO2: 26 mmol/L (ref 22–32)
Chloride: 99 mmol/L — ABNORMAL LOW (ref 101–111)
Creatinine, Ser: 1 mg/dL (ref 0.44–1.00)
GFR, EST AFRICAN AMERICAN: 56 mL/min — AB (ref >60)
GFR, EST NON AFRICAN AMERICAN: 48 mL/min — AB (ref >60)
Glucose, Bld: 148 mg/dL — ABNORMAL HIGH (ref 65–99)
Potassium: 4.1 mmol/L (ref 3.5–5.1)
SODIUM: 134 mmol/L — AB (ref 135–145)
TOTAL PROTEIN: 6.4 g/dL — AB (ref 6.5–8.1)
Total Bilirubin: 0.8 mg/dL (ref 0.3–1.2)

## 2016-11-16 LAB — CBC WITH DIFFERENTIAL/PLATELET
Basophils Absolute: 0.1 10*3/uL (ref 0–0.1)
Basophils Relative: 1 %
EOS ABS: 0.1 10*3/uL (ref 0–0.7)
EOS PCT: 1 %
HCT: 32 % — ABNORMAL LOW (ref 35.0–47.0)
HEMOGLOBIN: 11.3 g/dL — AB (ref 12.0–16.0)
LYMPHS ABS: 1.5 10*3/uL (ref 1.0–3.6)
Lymphocytes Relative: 16 %
MCH: 32.9 pg (ref 26.0–34.0)
MCHC: 35.4 g/dL (ref 32.0–36.0)
MCV: 92.9 fL (ref 80.0–100.0)
MONOS PCT: 7 %
Monocytes Absolute: 0.7 10*3/uL (ref 0.2–0.9)
NEUTROS PCT: 75 %
Neutro Abs: 7.4 10*3/uL — ABNORMAL HIGH (ref 1.4–6.5)
Platelets: 214 10*3/uL (ref 150–440)
RBC: 3.44 MIL/uL — ABNORMAL LOW (ref 3.80–5.20)
RDW: 12.8 % (ref 11.5–14.5)
WBC: 9.7 10*3/uL (ref 3.6–11.0)

## 2016-11-16 MED ORDER — CEPHALEXIN 500 MG PO CAPS: 500.0000 mg | ORAL_CAPSULE | Freq: Two times a day (BID) | ORAL | 0 refills | Status: AC

## 2016-11-16 NOTE — Discharge Instructions (Signed)
Please return immediately if condition worsens. Please contact her primary physician or the physician you were given for referral. If you have any specialist physicians involved in her treatment and plan please also contact them. Thank you for using [facility]. ° °

## 2016-11-16 NOTE — ED Triage Notes (Signed)
Started seeing people that are not there per daughter last night. This happens when she has urine infections per daughter. Pt c/o weakness everywhere that is worse today. Does have alzheimers as well.

## 2016-11-16 NOTE — ED Provider Notes (Signed)
Time Seen: Approximately *0959 I have reviewed the triage notes  Chief Complaint: Weakness   History of Present Illness: Christine [name] is a 82 y.o. female who has a history of Alzheimer's dementia and recurrent urinary tract infections. The patient's daughters primary historian states that she'll normally active way when she gets a urinary tract infection with frequent urination and some staying up all night and some agitation. The daughter is not aware of any fever or was with her last night. She states she didn't have any nausea, vomiting, diarrhea. No focal weakness though she gets a generalized weakness again typical for her with urinary tract infections   Past Medical History:  Diagnosis Date  . Alzheimer disease   . Diabetes mellitus without complication (HCC)   . Hypertension     There are no active problems to display for this patient.   History reviewed. No pertinent surgical history.  History reviewed. No pertinent surgical history.  Current Outpatient Rx  . Order #: [id number] Class: Historical Med  . Order #: [id number] Class: Historical Med  . Order #: [id number] Class: Historical Med  . Order #: [id number] Class: Historical Med  . Order #: [id number] Class: Historical Med  . Order #: [id number] Class: Historical Med  . Order #: [id number] Class: Historical Med  . Order #: [id number] Class: Historical Med  . Order #: [id number] Class: Historical Med  . Order #: [id number] Class: Historical Med  . Order #: [id number] Class: Historical Med    Allergies:  Ciprofloxacin; Macrobid [nitrofurantoin monohyd macro]; and Penicillins  Family History: History reviewed. No pertinent family history.  Social History: Social History  Substance Use Topics  . Smoking status: Never Smoker  . Smokeless tobacco: Never Used  . Alcohol use No     Review of Systems:   10 point review of systems was performed and was otherwise negative: Review of systems taken through the patient and  also with her daughter    Constitutional: No fever Eyes: No visual disturbances ENT: No sore throat, ear pain Cardiac: No chest pain Respiratory: No shortness of breath, wheezing, or stridor Abdomen: No abdominal pain, no vomiting, No diarrhea Endocrine: No weight loss, No night sweats Extremities: No peripheral edema, cyanosis Skin: No rashes, easy bruising Neurologic: No focal weakness, trouble with speech or swollowing Urologic: Urinary frequency without obvious pain with urination     Physical Exam:  ED Triage Vitals  Enc Vitals Group     BP 11/16/16 0923 127/64     Pulse Rate 11/16/16 0923 90     Resp 11/16/16 0923 18     Temp 11/16/16 0923 98 F (36.7 C)     Temp Source 11/16/16 0923 Oral     SpO2 11/16/16 0923 93 %     Weight 11/16/16 0921 150 lb (68 kg)     Height 11/16/16 0921 5\' 2"  (1.575 m)     Head Circumference --      Peak Flow --      Pain Score --      Pain Loc --      Pain Edu? --      Excl. in GC? --     General: Awake , Alert , and Oriented times 1, Glasgow Coma Scale 15 Head: Normal cephalic , atraumatic Eyes: Pupils equal , round, reactive to light Nose/Throat: No nasal drainage, patent upper airway without erythema or exudate.  Neck: Supple, Full range of motion, No anterior adenopathy or palpable thyroid masses Lungs: Clear to ascultation without wheezes , rhonchi, or rales Heart: Regular  rate, regular rhythm without murmurs , gallops , or rubs Abdomen: Soft, non tender without rebound, guarding , or rigidity; bowel sounds positive and symmetric in all 4 quadrants. No organomegaly .        Extremities: 2 plus symmetric pulses. No edema, clubbing or cyanosis Neurologic: normal ambulation, Motor symmetric without deficits, sensory intact Skin: warm, dry, no rashes   Labs:   All laboratory work was reviewed including any pertinent negatives or positives listed below:  Labs Reviewed  CBC WITH DIFFERENTIAL/PLATELET  COMPREHENSIVE METABOLIC  PANEL  URINALYSIS COMPLETEWITH MICROSCOPIC ([facility] ONLY)  Patient has findings consistent with a urinary tract infection and urine culture was added  EKG: * ED ECG REPORT I, Jennye MoccasinBrian S Kimberley Dastrup, the attending physician, personally viewed and interpreted this ECG.  Date: 11/16/2016 EKG Time: 0923 Rate: 89Rhythm: normal sinus rhythm QRS Axis: normal Intervals: Right bundle-branch block  Left anterior fascicular block ST/T Wave abnormalities: normal Conduction Disturbances: none Narrative Interpretation: unremarkable No acute ischemic changes   ED Course:  Patient's stay here was uneventful and according to the daughter apparently Keflex works very well for the patient. I prescribed her Keflex and again urine culture is pending at this time. I felt radiologic imaging was not necessary based on the patient's history review of systems and laboratory work etc. She also does not appear to be septic at this time from the bladder infection Clinical Course      Assessment: * Acute urinary tract infection Generalized weakness      Plan:  Outpatient management " Discharge Medication List as of 11/16/2016 11:32 AM    START taking these medications   Details  cephALEXin (KEFLEX) 500 MG capsule Take 1 capsule (500 mg total) by mouth 2 (two) times daily., Starting Wed 11/16/2016, Until Wed 11/23/2016, Print      "  Patient was advised to return immediately if condition worsens. Patient was advised to follow up with their primary care physician or other specialized physicians involved in their outpatient care. The patient and/or family member/power of attorney had laboratory results reviewed at the bedside. All questions and concerns were addressed and appropriate discharge instructions were distributed by the nursing staff.             Jennye MoccasinBrian S Yoshua Geisinger, MD 11/16/16 1328

## 2016-11-18 LAB — URINE CULTURE

## 2017-09-22 IMAGING — CT CT MAXILLOFACIAL W/O CM
3 of 7 series · 15 of 47 positions shown, 18 images · non-contrast
Comparison: None.

01/09/2015

CLINICAL DATA: Fall.  Confusion.

EXAM:
CT HEAD WITHOUT CONTRAST
CT MAXILLOFACIAL WITHOUT CONTRAST
TECHNIQUE: Multidetector CT imaging of the head and maxillofacial structures
were performed using the standard protocol without intravenous
contrast. Multiplanar CT image reconstructions of the maxillofacial
structures were also generated.

[Series 3: head bone · axial · 0.39mm/px · z∈[-161,-49]mm · 10 of 70 slices shown, 13 images]
[im 7/70  brain]
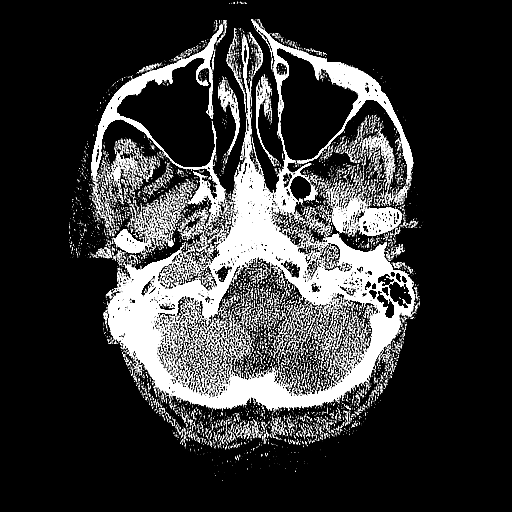
[im 7/70  bone]
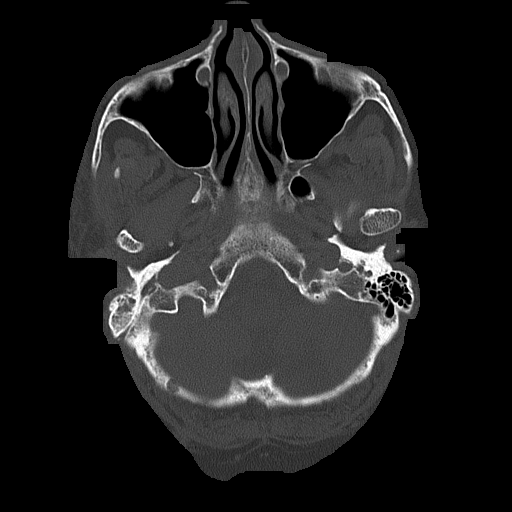
[im 13/70  bone]
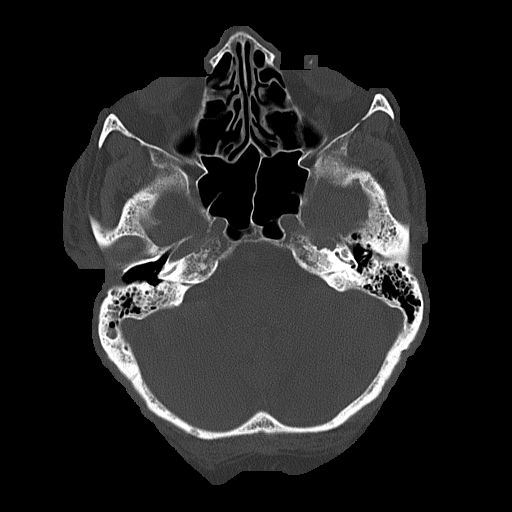
[im 19/70  bone]
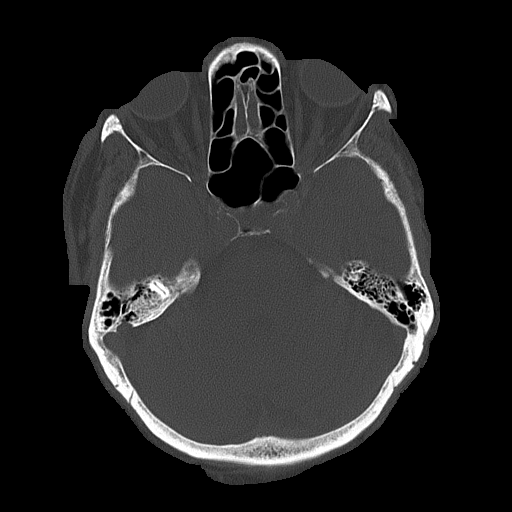
[im 26/70  bone]
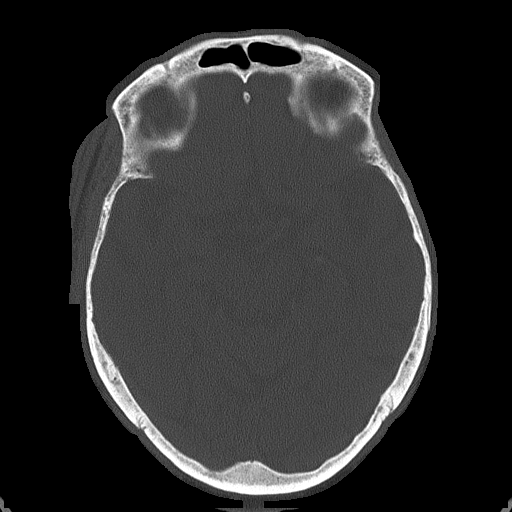
[im 32/70  brain]
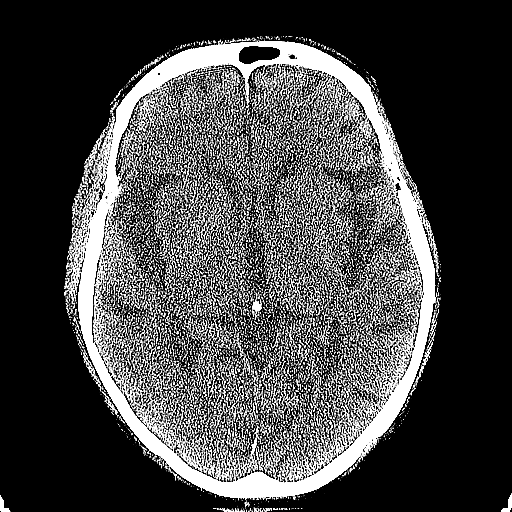
[im 32/70  bone]
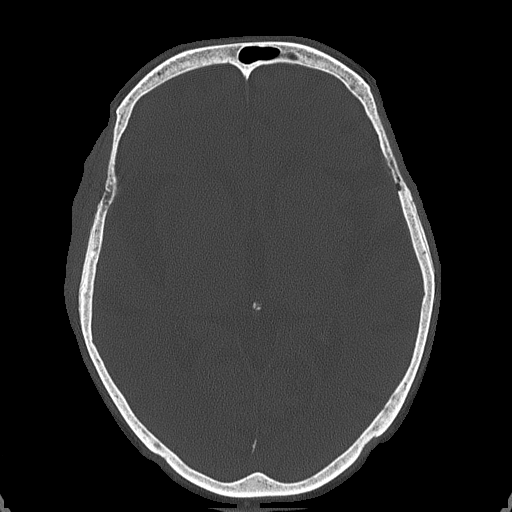
[im 38/70  bone]
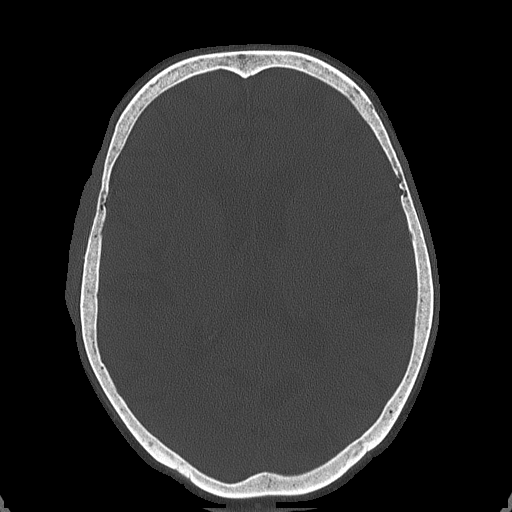
[im 44/70  bone]
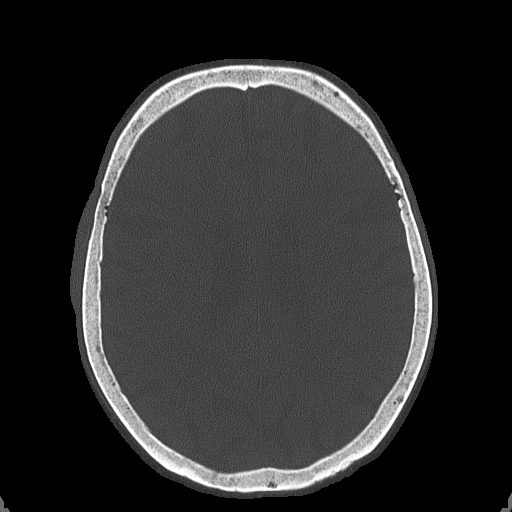
[im 51/70  bone]
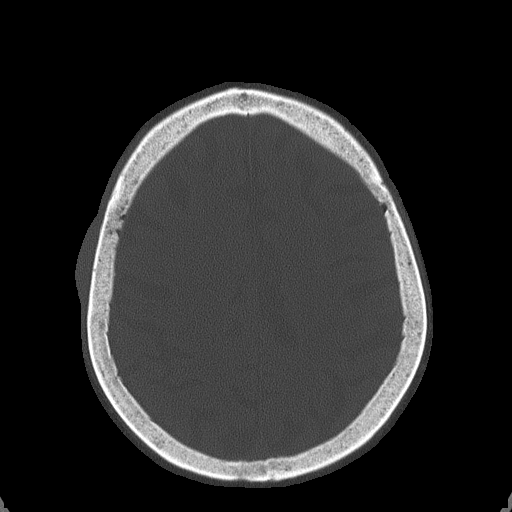
[im 57/70  brain]
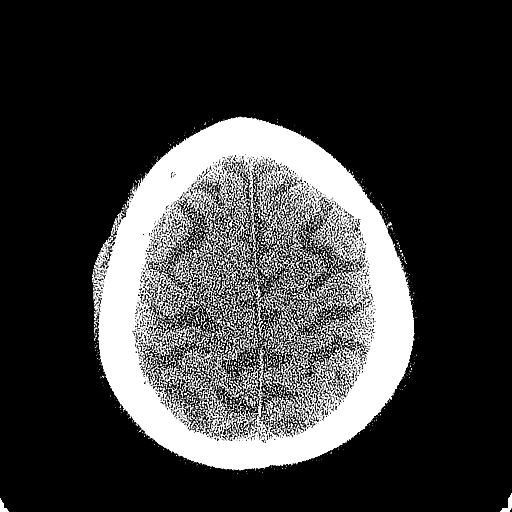
[im 57/70  bone]
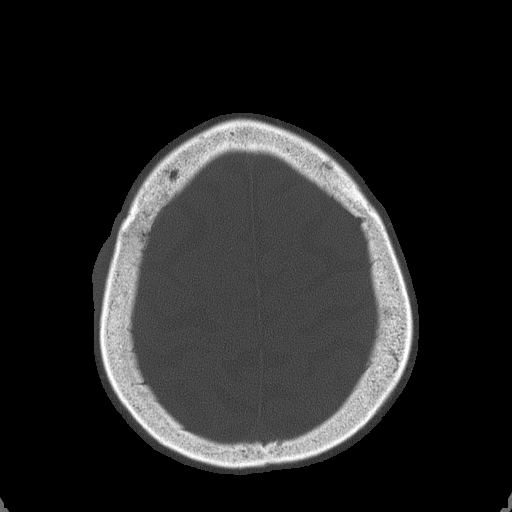
[im 63/70  bone]
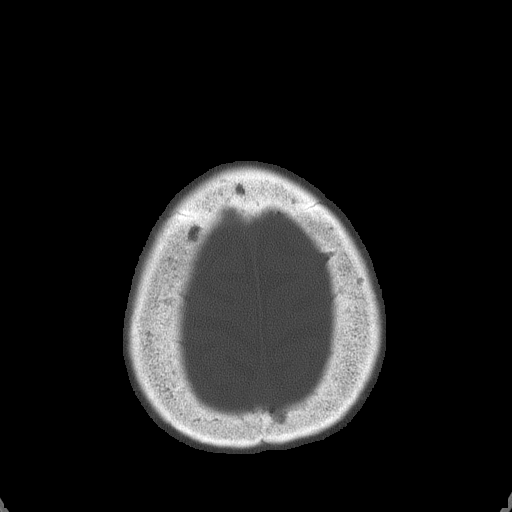

[Series 8: coronal soft tissue · coronal · 0.27mm/px · 3 of 62 slices shown]
[im 16/62  bone]
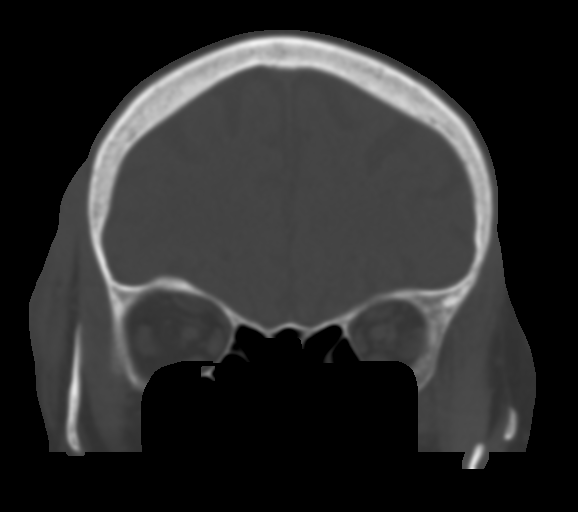
[im 31/62  bone]
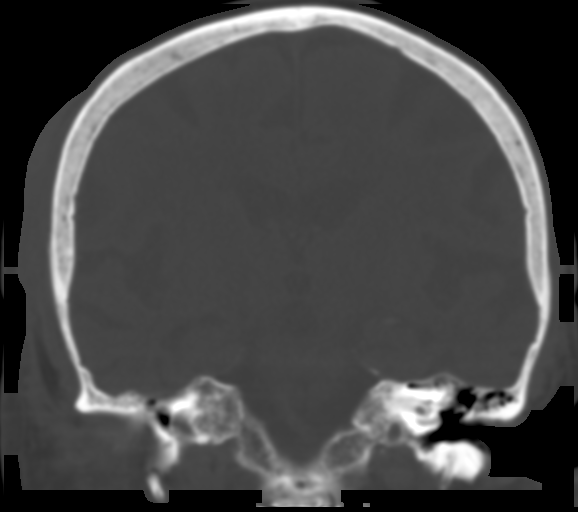
[im 46/62  bone]
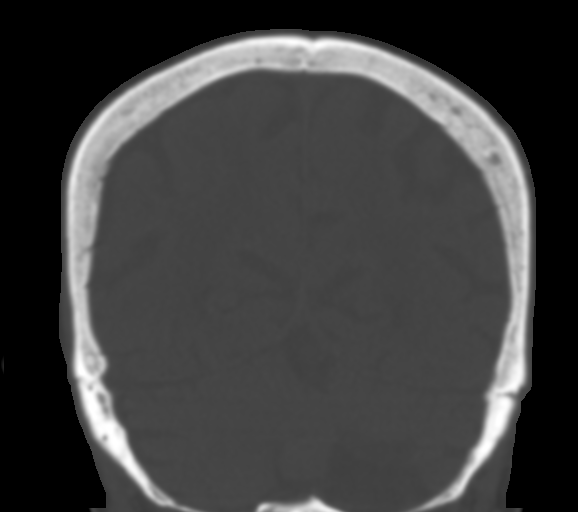

[Series 11: sagittal soft · sagittal · 0.32mm/px · 2 of 86 slices shown]
[im 29/86  bone]
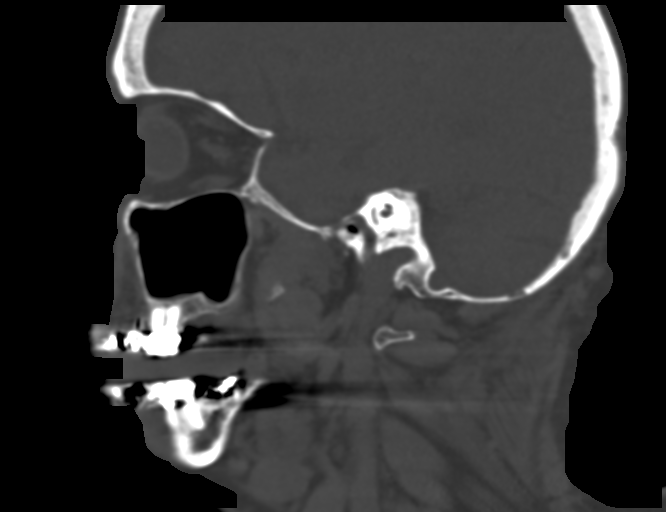
[im 57/86  bone]
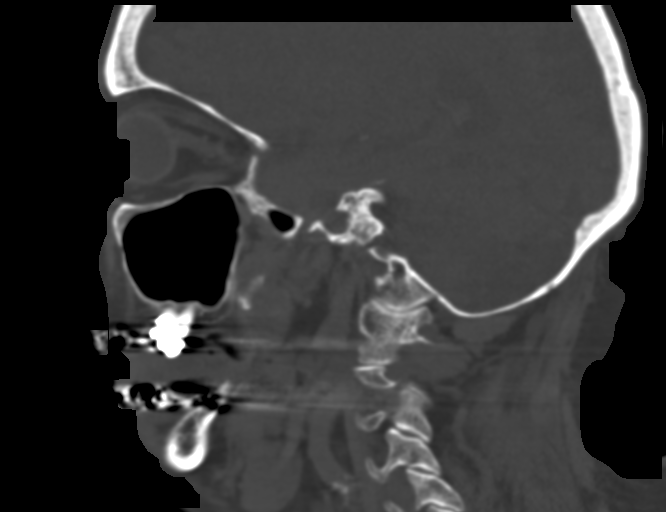

[15 of 47 positions shown; findings below may reference images not displayed]

FINDINGS: CT HEAD FINDINGS

Brain: No evidence of acute infarction, hemorrhage, hydrocephalus,
extra-axial collection or mass lesion/mass effect.Diffuse low
attenuation throughout the subcortical and the periventricular white
matter is noted compatible with chronic microvascular disease.
Prominence of the sulci and ventricles identified compatible with
brain atrophy. Left cerebellar hemisphere encephalomalacia is
identified and appears chronic and unchanged from prior exam.

Vascular: No hyperdense vessel or unexpected calcification.

Skull: Normal. Negative for fracture or focal lesion.

Sinuses/Orbits: The paranasal sinuses and the mastoid air cells
appear clear. The calvarium is intact.

Other: There is a large right frontal parietal scout hematoma
identified.

CT MAXILLOFACIAL FINDINGS

Osseous: No fracture or mandibular dislocation. No destructive
process.

Orbits: Negative. No traumatic or inflammatory finding.

Sinuses: The paranasal sinuses and the mastoid air cells are all
clear. No fracture

Soft tissues: Negative.
IMPRESSION: 1. Chronic small vessel ischemic disease and brain atrophy. No acute
intracranial abnormalities identified.
2. Large right frontal parietal scalp hematoma
3. No evidence for facial bone injury.

## 2018-02-19 ENCOUNTER — Encounter (INDEPENDENT_AMBULATORY_CARE_PROVIDER_SITE_OTHER): Payer: Self-pay | Admitting: Infectious Diseases

## 2020-09-25 DEATH — deceased
